# Patient Record
Sex: Male | Born: 1970 | Race: White | Hispanic: No | Smoking: Current some day smoker
Health system: Southern US, Community
[De-identification: ages and names within clinical notes are randomized; demographics above are authoritative.]

## PROBLEM LIST (undated history)

## (undated) DIAGNOSIS — F319 Bipolar disorder, unspecified: Secondary | ICD-10-CM

---

## 1999-03-26 ENCOUNTER — Encounter: Payer: Self-pay | Admitting: Emergency Medicine

## 1999-03-26 ENCOUNTER — Emergency Department (HOSPITAL_COMMUNITY): Admission: EM | Admit: 1999-03-26 | Discharge: 1999-03-26 | Payer: Self-pay | Admitting: Emergency Medicine

## 1999-04-02 ENCOUNTER — Emergency Department (HOSPITAL_COMMUNITY): Admission: EM | Admit: 1999-04-02 | Discharge: 1999-04-02 | Payer: Self-pay | Admitting: Emergency Medicine

## 2009-02-03 ENCOUNTER — Emergency Department (HOSPITAL_COMMUNITY): Admission: EM | Admit: 2009-02-03 | Discharge: 2009-02-04 | Payer: Self-pay | Admitting: Emergency Medicine

## 2009-02-03 ENCOUNTER — Ambulatory Visit: Payer: Self-pay | Admitting: Psychiatry

## 2009-02-04 ENCOUNTER — Inpatient Hospital Stay (HOSPITAL_COMMUNITY): Admission: EM | Admit: 2009-02-04 | Discharge: 2009-02-09 | Payer: Self-pay | Admitting: Psychiatry

## 2010-09-10 LAB — COMPREHENSIVE METABOLIC PANEL
ALT: 40 U/L (ref 0–53)
AST: 34 U/L (ref 0–37)
Albumin: 3.8 g/dL (ref 3.5–5.2)
Alkaline Phosphatase: 63 U/L (ref 39–117)
BUN: 12 mg/dL (ref 6–23)
CO2: 29 mEq/L (ref 19–32)
Calcium: 9.6 mg/dL (ref 8.4–10.5)
Chloride: 103 mEq/L (ref 96–112)
Creatinine, Ser: 0.95 mg/dL (ref 0.4–1.5)
GFR calc Af Amer: >60 mL/min (ref >60)
GFR calc non Af Amer: >60 mL/min (ref >60)
Glucose, Bld: 98 mg/dL (ref 70–99)
Potassium: 4.4 mEq/L (ref 3.5–5.1)
Sodium: 139 mEq/L (ref 135–145)
Total Bilirubin: 0.6 mg/dL (ref 0.3–1.2)
Total Protein: 7.8 g/dL (ref 6.0–8.3)

## 2010-09-10 LAB — RPR: RPR Ser Ql: NONREACTIVE

## 2010-09-10 LAB — TSH: TSH: 2.096 u[IU]/mL (ref 0.350–4.500)

## 2010-09-11 LAB — BASIC METABOLIC PANEL
BUN: 11 mg/dL (ref 6–23)
Calcium: 9.3 mg/dL (ref 8.4–10.5)
Creatinine, Ser: 1.05 mg/dL (ref 0.4–1.5)
GFR calc non Af Amer: >60 mL/min (ref >60)

## 2010-09-11 LAB — DIFFERENTIAL
Basophils Absolute: 0 10*3/uL (ref 0.0–0.1)
Eosinophils Absolute: 0 10*3/uL (ref 0.0–0.7)
Lymphocytes Relative: 11 % — ABNORMAL LOW (ref 12–46)
Lymphs Abs: 1.3 10*3/uL (ref 0.7–4.0)
Neutrophils Relative %: 80 % — ABNORMAL HIGH (ref 43–77)

## 2010-09-11 LAB — URINALYSIS, ROUTINE W REFLEX MICROSCOPIC
Glucose, UA: NEGATIVE mg/dL
Leukocytes, UA: NEGATIVE
Nitrite: NEGATIVE
Protein, ur: 30 mg/dL — AB
pH: 5.5 (ref 5.0–8.0)

## 2010-09-11 LAB — URINE CULTURE: Culture: NO GROWTH

## 2010-09-11 LAB — CBC
Platelets: 249 10*3/uL (ref 150–400)
WBC: 11.5 10*3/uL — ABNORMAL HIGH (ref 4.0–10.5)

## 2010-09-11 LAB — ETHANOL: Alcohol, Ethyl (B): <5 mg/dL (ref 0–10)

## 2010-09-11 LAB — URINE MICROSCOPIC-ADD ON

## 2010-09-11 LAB — RAPID URINE DRUG SCREEN, HOSP PERFORMED
Amphetamines: NOT DETECTED
Cocaine: NOT DETECTED
Tetrahydrocannabinol: NOT DETECTED
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
## 2014-03-16 ENCOUNTER — Observation Stay (HOSPITAL_COMMUNITY)
Admission: EM | Admit: 2014-03-16 | Discharge: 2014-03-18 | Disposition: A | Discharge status: Home / Self Care | Payer: BC Managed Care – PPO | Attending: General Surgery | Admitting: General Surgery

## 2014-03-16 ENCOUNTER — Emergency Department (HOSPITAL_COMMUNITY): Payer: BC Managed Care – PPO

## 2014-03-16 ENCOUNTER — Encounter (HOSPITAL_COMMUNITY): Payer: Self-pay | Admitting: Emergency Medicine

## 2014-03-16 DIAGNOSIS — S2232XA Fracture of one rib, left side, initial encounter for closed fracture: Secondary | ICD-10-CM

## 2014-03-16 DIAGNOSIS — Y92830 Public park as the place of occurrence of the external cause: Secondary | ICD-10-CM | POA: Diagnosis not present

## 2014-03-16 DIAGNOSIS — S2239XA Fracture of one rib, unspecified side, initial encounter for closed fracture: Secondary | ICD-10-CM | POA: Diagnosis present

## 2014-03-16 DIAGNOSIS — F17210 Nicotine dependence, cigarettes, uncomplicated: Secondary | ICD-10-CM | POA: Diagnosis not present

## 2014-03-16 DIAGNOSIS — S2249XA Multiple fractures of ribs, unspecified side, initial encounter for closed fracture: Secondary | ICD-10-CM | POA: Diagnosis not present

## 2014-03-16 DIAGNOSIS — S61412A Laceration without foreign body of left hand, initial encounter: Secondary | ICD-10-CM | POA: Diagnosis not present

## 2014-03-16 LAB — COMPREHENSIVE METABOLIC PANEL
ALBUMIN: 3.5 g/dL (ref 3.5–5.2)
ALT: 72 U/L — ABNORMAL HIGH (ref 0–53)
AST: 55 U/L — AB (ref 0–37)
Alkaline Phosphatase: 65 U/L (ref 39–117)
Anion gap: 13 (ref 5–15)
BUN: 14 mg/dL (ref 6–23)
CALCIUM: 9.1 mg/dL (ref 8.4–10.5)
CO2: 24 mEq/L (ref 19–32)
CREATININE: 1.01 mg/dL (ref 0.50–1.35)
Chloride: 102 mEq/L (ref 96–112)
GFR calc Af Amer: >90 mL/min (ref >90)
GFR calc non Af Amer: 89 mL/min — ABNORMAL LOW (ref >90)
Glucose, Bld: 131 mg/dL — ABNORMAL HIGH (ref 70–99)
Potassium: 3.6 mEq/L — ABNORMAL LOW (ref 3.7–5.3)
Sodium: 139 mEq/L (ref 137–147)
Total Bilirubin: 0.3 mg/dL (ref 0.3–1.2)
Total Protein: 7.4 g/dL (ref 6.0–8.3)

## 2014-03-16 LAB — CBC
HEMATOCRIT: 44.4 % (ref 39.0–52.0)
HEMOGLOBIN: 15.7 g/dL (ref 13.0–17.0)
MCH: 32.1 pg (ref 26.0–34.0)
MCHC: 35.4 g/dL (ref 30.0–36.0)
MCV: 90.8 fL (ref 78.0–100.0)
Platelets: 258 10*3/uL (ref 150–400)
RBC: 4.89 MIL/uL (ref 4.22–5.81)
RDW: 12 % (ref 11.5–15.5)
WBC: 12.1 10*3/uL — AB (ref 4.0–10.5)

## 2014-03-16 LAB — I-STAT CHEM 8, ED
BUN: 14 mg/dL (ref 6–23)
CALCIUM ION: 1.09 mmol/L — AB (ref 1.12–1.23)
CHLORIDE: 105 meq/L (ref 96–112)
Creatinine, Ser: 0.8 mg/dL (ref 0.50–1.35)
GLUCOSE: 126 mg/dL — AB (ref 70–99)
HCT: 49 % (ref 39.0–52.0)
Hemoglobin: 16.7 g/dL (ref 13.0–17.0)
Potassium: 4.2 mEq/L (ref 3.7–5.3)
Sodium: 142 mEq/L (ref 137–147)
TCO2: 24 mmol/L (ref 0–100)

## 2014-03-16 LAB — CDS SEROLOGY

## 2014-03-16 LAB — SAMPLE TO BLOOD BANK

## 2014-03-16 LAB — PROTIME-INR
INR: 1.01 (ref 0.00–1.49)
Prothrombin Time: 13.3 seconds (ref 11.6–15.2)

## 2014-03-16 LAB — ETHANOL: Alcohol, Ethyl (B): <11 mg/dL (ref 0–11)

## 2014-03-16 MED ORDER — BUPIVACAINE HCL 0.25 % IJ SOLN
10.0000 mL | Freq: Once | INTRAMUSCULAR | Status: AC
  Administered 2014-03-16: 10 mL
  Filled 2014-03-16: qty 10

## 2014-03-16 MED ORDER — TETANUS-DIPHTH-ACELL PERTUSSIS 5-2.5-18.5 LF-MCG/0.5 IM SUSP
0.5000 mL | Freq: Once | INTRAMUSCULAR | Status: AC
  Administered 2014-03-16: 0.5 mL via INTRAMUSCULAR
  Filled 2014-03-16: qty 0.5

## 2014-03-16 MED ORDER — ONDANSETRON HCL 4 MG/2ML IJ SOLN: 4.0000 mg | Freq: Four times a day (QID) | INTRAMUSCULAR | Status: DC | PRN

## 2014-03-16 MED ORDER — SODIUM CHLORIDE 0.9 % IV SOLN
INTRAVENOUS | Status: DC
  Administered 2014-03-16: via INTRAVENOUS

## 2014-03-16 MED ORDER — ENOXAPARIN SODIUM 40 MG/0.4ML ~~LOC~~ SOLN
40.0000 mg | SUBCUTANEOUS | Status: DC
  Filled 2014-03-16: qty 0.4

## 2014-03-16 MED ORDER — PANTOPRAZOLE SODIUM 40 MG IV SOLR
40.0000 mg | Freq: Every day | INTRAVENOUS | Status: DC
  Filled 2014-03-16: qty 40

## 2014-03-16 MED ORDER — PANTOPRAZOLE SODIUM 40 MG PO TBEC: 40.0000 mg | DELAYED_RELEASE_TABLET | Freq: Every day | ORAL | Status: DC

## 2014-03-16 MED ORDER — HYDROMORPHONE HCL 1 MG/ML IJ SOLN
1.0000 mg | Freq: Once | INTRAMUSCULAR | Status: AC
  Administered 2014-03-16: 1 mg via INTRAVENOUS
  Filled 2014-03-16: qty 1

## 2014-03-16 MED ORDER — SODIUM CHLORIDE 0.9 % IV BOLUS (SEPSIS)
1000.0000 mL | Freq: Once | INTRAVENOUS | Status: AC
  Administered 2014-03-16: 1000 mL via INTRAVENOUS

## 2014-03-16 MED ORDER — FENTANYL CITRATE 0.05 MG/ML IJ SOLN
100.0000 ug | Freq: Once | INTRAMUSCULAR | Status: AC
  Administered 2014-03-16: 100 ug via INTRAVENOUS
  Filled 2014-03-16: qty 2

## 2014-03-16 MED ORDER — CEFAZOLIN SODIUM 1-5 GM-% IV SOLN
1.0000 g | Freq: Three times a day (TID) | INTRAVENOUS | Status: DC
  Administered 2014-03-16: 1 g via INTRAVENOUS
  Filled 2014-03-16 (×2): qty 50

## 2014-03-16 MED ORDER — LIDOCAINE HCL (PF) 1 % IJ SOLN
5.0000 mL | Freq: Once | INTRAMUSCULAR | Status: AC
  Administered 2014-03-16: 5 mL via INTRADERMAL
  Filled 2014-03-16: qty 5

## 2014-03-16 MED ORDER — ONDANSETRON HCL 4 MG PO TABS: 4.0000 mg | ORAL_TABLET | Freq: Four times a day (QID) | ORAL | Status: DC | PRN

## 2014-03-16 MED ORDER — CEFAZOLIN SODIUM 1-5 GM-% IV SOLN
1.0000 g | Freq: Three times a day (TID) | INTRAVENOUS | Status: DC
Start: 2014-03-17 — End: 2014-03-17
  Administered 2014-03-17: 1 g via INTRAVENOUS
  Filled 2014-03-16 (×3): qty 50

## 2014-03-16 MED ORDER — HYDROCODONE-ACETAMINOPHEN 5-325 MG PO TABS
2.0000 | ORAL_TABLET | ORAL | Status: DC | PRN
  Administered 2014-03-17 – 2014-03-18 (×2): 2 via ORAL
  Filled 2014-03-16 (×3): qty 2

## 2014-03-16 MED ORDER — IOHEXOL 300 MG/ML  SOLN
100.0000 mL | Freq: Once | INTRAMUSCULAR | Status: AC | PRN
  Administered 2014-03-16: 100 mL via INTRAVENOUS

## 2014-03-16 MED ORDER — HYDROMORPHONE HCL 1 MG/ML IJ SOLN
1.0000 mg | INTRAMUSCULAR | Status: DC | PRN
  Administered 2014-03-17 (×4): 1 mg via INTRAVENOUS
  Filled 2014-03-16 (×5): qty 1
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
## 2014-03-16 NOTE — ED Notes (Signed)
Portable xray at bedside.
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
## 2014-03-16 NOTE — ED Notes (Signed)
Patient returned from CT

## 2014-03-16 NOTE — ED Notes (Signed)
The pt returned from xray c/o pain   bp still low.  Iv nss wo.  Family at the bedside

## 2014-03-16 NOTE — ED Notes (Signed)
The pt just came back from xray .  They just took him back to c-t bp still low.  C/o pain everywhere especially in  His lt ribs anterior and posterior
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
## 2014-03-16 NOTE — ED Notes (Signed)
MD at bedside to suture pt hand

## 2014-03-16 NOTE — ED Notes (Signed)
Dr Wyatt at bedside.  

## 2014-03-17 ENCOUNTER — Observation Stay (HOSPITAL_COMMUNITY): Payer: BC Managed Care – PPO

## 2014-03-17 DIAGNOSIS — S61412A Laceration without foreign body of left hand, initial encounter: Secondary | ICD-10-CM | POA: Diagnosis present

## 2014-03-17 LAB — CBC
HEMATOCRIT: 43.2 % (ref 39.0–52.0)
Hemoglobin: 14.7 g/dL (ref 13.0–17.0)
MCH: 32.2 pg (ref 26.0–34.0)
MCHC: 34 g/dL (ref 30.0–36.0)
MCV: 94.7 fL (ref 78.0–100.0)
Platelets: 234 10*3/uL (ref 150–400)
RBC: 4.56 MIL/uL (ref 4.22–5.81)
RDW: 12.5 % (ref 11.5–15.5)
WBC: 10.3 10*3/uL (ref 4.0–10.5)

## 2014-03-17 LAB — BASIC METABOLIC PANEL
Anion gap: 12 (ref 5–15)
BUN: 11 mg/dL (ref 6–23)
CALCIUM: 8.5 mg/dL (ref 8.4–10.5)
CO2: 23 mEq/L (ref 19–32)
CREATININE: 0.84 mg/dL (ref 0.50–1.35)
Chloride: 105 mEq/L (ref 96–112)
Glucose, Bld: 112 mg/dL — ABNORMAL HIGH (ref 70–99)
Potassium: 4.1 mEq/L (ref 3.7–5.3)
Sodium: 140 mEq/L (ref 137–147)

## 2014-03-17 MED ORDER — DOCUSATE SODIUM 100 MG PO CAPS
100.0000 mg | ORAL_CAPSULE | Freq: Two times a day (BID) | ORAL | Status: DC
  Administered 2014-03-17 (×2): 100 mg via ORAL
  Filled 2014-03-17 (×4): qty 1

## 2014-03-17 MED ORDER — PNEUMOCOCCAL VAC POLYVALENT 25 MCG/0.5ML IJ INJ
0.5000 mL | INJECTION | INTRAMUSCULAR | Status: DC
  Filled 2014-03-17: qty 0.5

## 2014-03-17 MED ORDER — OXYCODONE HCL 5 MG PO TABS
5.0000 mg | ORAL_TABLET | ORAL | Status: DC | PRN
  Administered 2014-03-17 (×2): 10 mg via ORAL
  Administered 2014-03-18 (×2): 15 mg via ORAL
  Filled 2014-03-17 (×2): qty 2
  Filled 2014-03-17: qty 3
  Filled 2014-03-17: qty 1
  Filled 2014-03-17: qty 2

## 2014-03-17 MED ORDER — ENOXAPARIN SODIUM 40 MG/0.4ML ~~LOC~~ SOLN
40.0000 mg | Freq: Two times a day (BID) | SUBCUTANEOUS | Status: DC
Start: 2014-03-17 — End: 2014-03-18
  Administered 2014-03-17 (×2): 40 mg via SUBCUTANEOUS
  Filled 2014-03-17 (×5): qty 0.4

## 2014-03-17 MED ORDER — POLYETHYLENE GLYCOL 3350 17 G PO PACK
17.0000 g | PACK | Freq: Every day | ORAL | Status: DC
  Administered 2014-03-17: 17 g via ORAL
  Filled 2014-03-17 (×3): qty 1

## 2014-03-17 MED ORDER — HYDROMORPHONE HCL 1 MG/ML IJ SOLN
0.5000 mg | INTRAMUSCULAR | Status: DC | PRN
  Filled 2014-03-17: qty 1

## 2014-03-17 NOTE — Progress Notes (Signed)
Patient ID: Jared CrossLonnie Costa, male   DOB: 01/24/1971, 43 y.o.   MRN: [id number]   LOS: 1 day   Subjective: No unexpected c/o.   Objective: Vital signs in last 24 hours: Temp:  [97.9 F (36.6 C)-99.2 F (37.3 C)] 99.2 F (37.3 C) (10/12 0731) Pulse Rate:  [70-107] 96 (10/12 0731) Resp:  [10-20] 18 (10/12 0731) BP: (95-126)/(57-96) 106/72 mmHg (10/12 0731) SpO2:  [93 %-100 %] 97 % (10/12 0731) Weight:  [280 lb 13.9 oz (127.4 kg)] 280 lb 13.9 oz (127.4 kg) (10/12 0134)    IS: 1250ml   Laboratory  CBC  Recent Labs  03/16/14 1741 03/16/14 2153 03/17/14 0631  WBC 12.1*  --  10.3  HGB 15.7 16.7 14.7  HCT 44.4 49.0 43.2  PLT 258  --  234   BMET  Recent Labs  03/16/14 1741 03/16/14 2153 03/17/14 0631  NA 139 142 140  K 3.6* 4.2 4.1  CL 102 105 105  CO2 24  --  23  GLUCOSE 131* 126* 112*  BUN 14 14 11   CREATININE 1.01 0.80 0.84  CALCIUM 9.1  --  8.5    Radiology Results CHEST 2 VIEW  COMPARISON: Chest radiograph and chest CT March 16, 2014  FINDINGS:  There are displaced fractures of the posterior left third and  posterolateral left fourth ribs. No pneumothorax or effusion. Lungs  are clear. Heart size and pulmonary vascularity are normal.  IMPRESSION:  Rib fractures on the left. No pneumothorax. Lungs clear.  Electronically Signed  By: Bretta BangWilliam Woodruff M.D.  On: 03/17/2014 08:03   Physical Exam General appearance: alert and no distress Resp: clear to auscultation bilaterally Cardio: regular rate and rhythm GI: normal findings: bowel sounds normal and soft, non-tender   Assessment/Plan: [facility] Multiple left rib fxs -- Pulmonary toilet Left hand laceration s/p repair -- Dr. Ophelia CharterYates to see this am FEN -- Orals for pain, SL IV, bowel regimen, advance diet VTE -- SCD's, Lovenox (increase for weight) Dispo -- Pain control, PT/OT    Jared CaldronMichael J. Crosby Bevan, PA-C Pager: [phone] General Trauma PA Pager: [phone]  03/17/2014

## 2014-03-17 NOTE — ED Notes (Signed)
Transporting patient to new room assignment.,

## 2014-03-17 NOTE — Progress Notes (Signed)
I spoke with his family also. Patient examined and I agree with the assessment and plan  Violeta GelinasBurke Keaden Gunnoe, MD, MPH, FACS Trauma: [phone] General Surgery: [phone]  03/17/2014 12:29 PM
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
## 2014-03-17 NOTE — Progress Notes (Signed)
UR completed 

## 2014-03-18 MED ORDER — NAPROXEN 500 MG PO TABS: 500.0000 mg | ORAL_TABLET | Freq: Two times a day (BID) | ORAL | Status: DC

## 2014-03-18 MED ORDER — OXYCODONE-ACETAMINOPHEN 7.5-325 MG PO TABS: 1.0000 | ORAL_TABLET | ORAL | Status: DC | PRN
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
## 2014-03-18 NOTE — Discharge Instructions (Signed)
Increase activity as pain allows.  Wash wounds daily in shower with soap and water. Do not soak. Apply antibiotic ointment (e.g. Neosporin) twice daily and as needed to keep moist. Cover with dry dressing.  No driving while taking oxycodone.
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
## 2014-03-18 NOTE — Discharge Summary (Signed)
Averyana Pillars, MD, MPH, FACS Trauma: [phone] General Surgery: [phone]  

## 2014-03-18 NOTE — Progress Notes (Signed)
Dressing removed

## 2014-03-18 NOTE — Progress Notes (Signed)
Agree Sheza Strickland, MD, MPH, FACS Trauma: [phone] General Surgery: [phone]  
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
## 2016-08-10 DIAGNOSIS — Z6835 Body mass index (BMI) 35.0-35.9, adult: Secondary | ICD-10-CM | POA: Diagnosis not present

## 2016-08-10 DIAGNOSIS — Z0000 Encounter for general adult medical examination without abnormal findings: Secondary | ICD-10-CM | POA: Diagnosis not present

## 2016-08-10 DIAGNOSIS — Z1389 Encounter for screening for other disorder: Secondary | ICD-10-CM | POA: Diagnosis not present

## 2016-08-11 DIAGNOSIS — Z6835 Body mass index (BMI) 35.0-35.9, adult: Secondary | ICD-10-CM | POA: Diagnosis not present

## 2016-08-11 DIAGNOSIS — Z024 Encounter for examination for driving license: Secondary | ICD-10-CM | POA: Diagnosis not present

## 2016-08-11 DIAGNOSIS — Z1389 Encounter for screening for other disorder: Secondary | ICD-10-CM | POA: Diagnosis not present

## 2016-08-11 DIAGNOSIS — E6609 Other obesity due to excess calories: Secondary | ICD-10-CM | POA: Diagnosis not present

## 2018-01-17 DIAGNOSIS — Z0000 Encounter for general adult medical examination without abnormal findings: Secondary | ICD-10-CM | POA: Diagnosis not present

## 2018-01-17 DIAGNOSIS — E6609 Other obesity due to excess calories: Secondary | ICD-10-CM | POA: Diagnosis not present

## 2018-01-17 DIAGNOSIS — Z6835 Body mass index (BMI) 35.0-35.9, adult: Secondary | ICD-10-CM | POA: Diagnosis not present

## 2018-01-17 DIAGNOSIS — Z719 Counseling, unspecified: Secondary | ICD-10-CM | POA: Diagnosis not present

## 2018-01-17 DIAGNOSIS — Z1389 Encounter for screening for other disorder: Secondary | ICD-10-CM | POA: Diagnosis not present

## 2018-07-02 DIAGNOSIS — Z0000 Encounter for general adult medical examination without abnormal findings: Secondary | ICD-10-CM | POA: Diagnosis not present

## 2018-07-02 DIAGNOSIS — E6609 Other obesity due to excess calories: Secondary | ICD-10-CM | POA: Diagnosis not present

## 2018-07-02 DIAGNOSIS — Z6836 Body mass index (BMI) 36.0-36.9, adult: Secondary | ICD-10-CM | POA: Diagnosis not present

## 2018-07-02 DIAGNOSIS — Z1389 Encounter for screening for other disorder: Secondary | ICD-10-CM | POA: Diagnosis not present

## 2018-09-04 DIAGNOSIS — R51 Headache: Secondary | ICD-10-CM | POA: Diagnosis not present

## 2018-09-04 DIAGNOSIS — K591 Functional diarrhea: Secondary | ICD-10-CM | POA: Diagnosis not present

## 2019-05-14 ENCOUNTER — Other Ambulatory Visit: Payer: Self-pay

## 2019-05-14 DIAGNOSIS — Z20822 Contact with and (suspected) exposure to covid-19: Secondary | ICD-10-CM

## 2019-05-16 ENCOUNTER — Telehealth: Payer: Self-pay | Admitting: *Deleted

## 2019-05-16 NOTE — Telephone Encounter (Signed)
Pt called for result of COVID test on 05/14/2019; explained turn around time for results is based on the number of results that have to be completed; pt also informed results are available first in MyChart, and he will receive a call regarding his results; pt encouraged to answer all calls; he verbalized understanding.

## 2019-05-19 LAB — NOVEL CORONAVIRUS, NAA: SARS-CoV-2, NAA: NOT DETECTED

## 2019-05-19 NOTE — Telephone Encounter (Signed)
Pt called for lab results. Result are not back. Advised pt to call back if results are not back tomorrow.

## 2019-07-09 DIAGNOSIS — Z20828 Contact with and (suspected) exposure to other viral communicable diseases: Secondary | ICD-10-CM | POA: Diagnosis not present

## 2019-07-09 DIAGNOSIS — Z03818 Encounter for observation for suspected exposure to other biological agents ruled out: Secondary | ICD-10-CM | POA: Diagnosis not present

## 2020-07-01 DIAGNOSIS — Z1331 Encounter for screening for depression: Secondary | ICD-10-CM | POA: Diagnosis not present

## 2020-07-01 DIAGNOSIS — Z1389 Encounter for screening for other disorder: Secondary | ICD-10-CM | POA: Diagnosis not present

## 2020-07-01 DIAGNOSIS — Z6835 Body mass index (BMI) 35.0-35.9, adult: Secondary | ICD-10-CM | POA: Diagnosis not present

## 2020-07-01 DIAGNOSIS — Z0000 Encounter for general adult medical examination without abnormal findings: Secondary | ICD-10-CM | POA: Diagnosis not present

## 2020-07-01 DIAGNOSIS — E6609 Other obesity due to excess calories: Secondary | ICD-10-CM | POA: Diagnosis not present

## 2021-01-01 DIAGNOSIS — Z72820 Sleep deprivation: Secondary | ICD-10-CM | POA: Diagnosis not present

## 2021-01-01 DIAGNOSIS — R4589 Other symptoms and signs involving emotional state: Secondary | ICD-10-CM | POA: Diagnosis not present

## 2021-01-01 DIAGNOSIS — G4700 Insomnia, unspecified: Secondary | ICD-10-CM | POA: Diagnosis not present

## 2021-01-01 DIAGNOSIS — R5383 Other fatigue: Secondary | ICD-10-CM | POA: Diagnosis not present

## 2021-01-01 DIAGNOSIS — Z6833 Body mass index (BMI) 33.0-33.9, adult: Secondary | ICD-10-CM | POA: Diagnosis not present

## 2021-01-01 DIAGNOSIS — F3189 Other bipolar disorder: Secondary | ICD-10-CM | POA: Diagnosis not present

## 2021-01-01 DIAGNOSIS — E6609 Other obesity due to excess calories: Secondary | ICD-10-CM | POA: Diagnosis not present

## 2021-03-15 DIAGNOSIS — Z5900 Homelessness unspecified: Secondary | ICD-10-CM | POA: Diagnosis not present

## 2021-03-16 DIAGNOSIS — R45850 Homicidal ideations: Secondary | ICD-10-CM | POA: Diagnosis not present

## 2021-03-16 DIAGNOSIS — Z20822 Contact with and (suspected) exposure to covid-19: Secondary | ICD-10-CM | POA: Diagnosis not present

## 2021-03-16 DIAGNOSIS — Z5900 Homelessness unspecified: Secondary | ICD-10-CM | POA: Diagnosis not present

## 2021-03-16 DIAGNOSIS — F319 Bipolar disorder, unspecified: Secondary | ICD-10-CM | POA: Diagnosis not present

## 2021-03-16 DIAGNOSIS — F4325 Adjustment disorder with mixed disturbance of emotions and conduct: Secondary | ICD-10-CM | POA: Diagnosis not present

## 2021-03-16 DIAGNOSIS — M79669 Pain in unspecified lower leg: Secondary | ICD-10-CM | POA: Diagnosis not present

## 2021-03-16 DIAGNOSIS — F3112 Bipolar disorder, current episode manic without psychotic features, moderate: Secondary | ICD-10-CM | POA: Diagnosis not present

## 2021-03-16 DIAGNOSIS — F441 Dissociative fugue: Secondary | ICD-10-CM | POA: Diagnosis not present

## 2021-03-16 DIAGNOSIS — F17210 Nicotine dependence, cigarettes, uncomplicated: Secondary | ICD-10-CM | POA: Diagnosis not present

## 2021-03-31 DIAGNOSIS — F333 Major depressive disorder, recurrent, severe with psychotic symptoms: Secondary | ICD-10-CM | POA: Diagnosis not present

## 2021-03-31 DIAGNOSIS — Z6834 Body mass index (BMI) 34.0-34.9, adult: Secondary | ICD-10-CM | POA: Diagnosis not present

## 2023-03-19 ENCOUNTER — Encounter (HOSPITAL_COMMUNITY): Payer: Self-pay

## 2023-03-19 ENCOUNTER — Emergency Department (HOSPITAL_COMMUNITY)
Admission: EM | Admit: 2023-03-19 | Discharge: 2023-03-21 | Disposition: A | Discharge status: Home / Self Care | Payer: Commercial Managed Care - PPO | Attending: Emergency Medicine | Admitting: Emergency Medicine

## 2023-03-19 ENCOUNTER — Other Ambulatory Visit: Payer: Self-pay

## 2023-03-19 DIAGNOSIS — F312 Bipolar disorder, current episode manic severe with psychotic features: Secondary | ICD-10-CM | POA: Insufficient documentation

## 2023-03-19 DIAGNOSIS — F17200 Nicotine dependence, unspecified, uncomplicated: Secondary | ICD-10-CM | POA: Insufficient documentation

## 2023-03-19 DIAGNOSIS — F3110 Bipolar disorder, current episode manic without psychotic features, unspecified: Secondary | ICD-10-CM | POA: Insufficient documentation

## 2023-03-19 DIAGNOSIS — Z8659 Personal history of other mental and behavioral disorders: Secondary | ICD-10-CM

## 2023-03-19 DIAGNOSIS — Z7982 Long term (current) use of aspirin: Secondary | ICD-10-CM | POA: Insufficient documentation

## 2023-03-19 DIAGNOSIS — R4689 Other symptoms and signs involving appearance and behavior: Secondary | ICD-10-CM | POA: Diagnosis present

## 2023-03-19 DIAGNOSIS — F29 Unspecified psychosis not due to a substance or known physiological condition: Secondary | ICD-10-CM | POA: Insufficient documentation

## 2023-03-19 HISTORY — DX: Bipolar disorder, unspecified: F31.9

## 2023-03-19 LAB — URINALYSIS, W/ REFLEX TO CULTURE (INFECTION SUSPECTED)
Bacteria, UA: NONE SEEN
Bilirubin Urine: NEGATIVE
Glucose, UA: NEGATIVE mg/dL
Hgb urine dipstick: NEGATIVE
Ketones, ur: 5 mg/dL — AB
Leukocytes,Ua: NEGATIVE
Nitrite: NEGATIVE
Protein, ur: 30 mg/dL — AB
Specific Gravity, Urine: 1.02 (ref 1.005–1.030)
pH: 7 (ref 5.0–8.0)

## 2023-03-19 LAB — CBC WITH DIFFERENTIAL/PLATELET
Abs Immature Granulocytes: 0.03 10*3/uL (ref 0.00–0.07)
Basophils Absolute: 0.1 10*3/uL (ref 0.0–0.1)
Basophils Relative: 1 %
Eosinophils Absolute: 0.1 10*3/uL (ref 0.0–0.5)
Eosinophils Relative: 2 %
HCT: 46.3 % (ref 39.0–52.0)
Hemoglobin: 15.4 g/dL (ref 13.0–17.0)
Immature Granulocytes: 0 %
Lymphocytes Relative: 20 %
Lymphs Abs: 1.5 10*3/uL (ref 0.7–4.0)
MCH: 32.1 pg (ref 26.0–34.0)
MCHC: 33.3 g/dL (ref 30.0–36.0)
MCV: 96.5 fL (ref 80.0–100.0)
Monocytes Absolute: 0.7 10*3/uL (ref 0.1–1.0)
Monocytes Relative: 10 %
Neutro Abs: 4.9 10*3/uL (ref 1.7–7.7)
Neutrophils Relative %: 67 %
Platelets: 244 10*3/uL (ref 150–400)
RBC: 4.8 MIL/uL (ref 4.22–5.81)
RDW: 12.1 % (ref 11.5–15.5)
WBC: 7.3 10*3/uL (ref 4.0–10.5)
nRBC: 0 % (ref 0.0–0.2)

## 2023-03-19 LAB — COMPREHENSIVE METABOLIC PANEL
ALT: 54 U/L — ABNORMAL HIGH (ref 0–44)
AST: 44 U/L — ABNORMAL HIGH (ref 15–41)
Albumin: 3.7 g/dL (ref 3.5–5.0)
Alkaline Phosphatase: 57 U/L (ref 38–126)
Anion gap: 8 (ref 5–15)
BUN: 20 mg/dL (ref 6–20)
CO2: 24 mmol/L (ref 22–32)
Calcium: 9 mg/dL (ref 8.9–10.3)
Chloride: 105 mmol/L (ref 98–111)
Creatinine, Ser: 1.08 mg/dL (ref 0.61–1.24)
GFR, Estimated: >60 mL/min (ref >60)
Glucose, Bld: 118 mg/dL — ABNORMAL HIGH (ref 70–99)
Potassium: 3.6 mmol/L (ref 3.5–5.1)
Sodium: 137 mmol/L (ref 135–145)
Total Bilirubin: 0.5 mg/dL (ref 0.3–1.2)
Total Protein: 6.8 g/dL (ref 6.5–8.1)

## 2023-03-19 LAB — RAPID URINE DRUG SCREEN, HOSP PERFORMED
Amphetamines: NOT DETECTED
Barbiturates: NOT DETECTED
Benzodiazepines: NOT DETECTED
Cocaine: NOT DETECTED
Opiates: NOT DETECTED
Tetrahydrocannabinol: NOT DETECTED

## 2023-03-19 LAB — LITHIUM LEVEL: Lithium Lvl: <0.06 mmol/L — ABNORMAL LOW (ref 0.60–1.20)

## 2023-03-19 LAB — ETHANOL: Alcohol, Ethyl (B): <10 mg/dL (ref <10)

## 2023-03-19 MED ORDER — ZIPRASIDONE MESYLATE 20 MG IM SOLR: 20.0000 mg | Freq: Four times a day (QID) | INTRAMUSCULAR | Status: DC | PRN

## 2023-03-19 MED ORDER — LORAZEPAM 1 MG PO TABS
1.0000 mg | ORAL_TABLET | Freq: Once | ORAL | Status: AC
  Administered 2023-03-19: 1 mg via ORAL
  Filled 2023-03-19: qty 1

## 2023-03-19 MED ORDER — LORAZEPAM 1 MG PO TABS
1.0000 mg | ORAL_TABLET | Freq: Four times a day (QID) | ORAL | Status: DC | PRN
  Administered 2023-03-19 – 2023-03-20 (×2): 1 mg via ORAL
  Filled 2023-03-19 (×2): qty 1

## 2023-03-19 MED ORDER — STERILE WATER FOR INJECTION IJ SOLN: 1.2000 mL | Freq: Once | INTRAMUSCULAR | Status: DC

## 2023-03-19 NOTE — ED Notes (Signed)
Pt is awake. Pt seems confused. Pt is being escorted to restroom accompanied by sitter. Urine sample requested.

## 2023-03-19 NOTE — ED Notes (Signed)
Pt standing in door frame talking with sitter

## 2023-03-19 NOTE — ED Notes (Signed)
Pt resting in bed with eyes closed; sitter at bedside. Without incident this far in shift.
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
## 2023-03-19 NOTE — ED Notes (Signed)
Pt in room talking to himself. No one else in room

## 2023-03-19 NOTE — Progress Notes (Signed)
LCSW Progress Note  [id number]   Jared Costa  03/19/2023  7:00 PM    Inpatient Behavioral Health Placement  Pt meets inpatient criteria per Vernard Gambles, NP. There are no available beds within [facility] system per [facility] [employer]. Referral was sent to the following facilities;  Destination  Service Provider Address Phone [facility]  [address] [place], [place] [address] [phone]  [facility]  [address] [place] [address] [phone]  [facility]  [address] [place] [address] [phone]  [facility] [address]  [place] [address] [phone]  [facility]  [address] [place], [place] [address] [phone]  [facility]  [address] [place] [address] [phone]  [facility]  [address] [place] [address] [phone]  [facility]  [address] [place], [place] [address] [phone]  [facility]  [address] [place] [address] [phone]  [facility]  [address] [place] [address] [phone]  [facility]  [address], [place] [address] [phone]  [facility]  [address] [place] [address] [phone]  [facility]  [address], [place] [address] [phone]  [facility]  [address] [place] [address] [phone]  [facility]  [address] [place], [place] [phone]  [facility]  [address], [place] [address] [phone]  [facility]  [address], [place] [address] [phone]  [facility]  [address] [place] [address] [phone]  [facility]  [address], [place] [address] [phone]  [facility]  [address], [place] [address] [phone]  [facility]  [place] [phone]  [facility]  [address], [place] [address] [phone]  [facility]  [address] [place] [address] [phone]  [facility]  [address], [place] [address] [phone]  [facility] Patient Placement  [facility], [place] [phone]  [facility]  [address] [place] [address] [phone]     Situation ongoing,  CSW will follow up.    Maryjean Ka, MSW, LCSWA 03/19/2023 7:00 PM

## 2023-03-19 NOTE — BHH Counselor (Signed)
Telephone contact 5:45pm  Collateral:  Parent's reported patient has been diagnosed with Bipolar and Depression. Report he cannot take Lithium due to affects his performance at work, patient works outside. Report he will not take Lithium because the patient reports it makes him sick. 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 Report he has a hard time taking pills. Patient's parent report the patient stated he took 3 pills yesterday but they do not know how true that is. Report he laid out of work last week three days Wednesday, Thursday, and Friday. Report the patient does need help. Report the day before yesterday the parent road his motorcycle up the hill wearing nothing on but his G-string. Parents report patient stays up all night with no sleep.   Collateral: Patient's son, Ndrew Creason, They report patient needs to get help before being discharged. Report he needs medication for Bipolar where he can take the medication, work outside and not have any side effects.  The family wants the patient stabilized but does not want him on Lithium due to the patient will not take the medication because it will make him sick. The patient needs to be on a medication with no side effects related to the sun or working outside.    Disposition: Eber Jones, NP, has recommended inpatient treatment.

## 2023-03-19 NOTE — ED Triage Notes (Signed)
Per [facility], a [facility] Resident called 911 reporting the patient was found at 3am standing above their 44yr granddaughter without pants on, and then they called 911. The Pt then left the residence. When [facility] responded to the call, the Pt was already in his vehicle driving away from the residence, but turned around and followed the Deputy back to the residence. Deputy found Patient exiting personal vehicle wearing a G-String, without pants. Patient reports he was asked to the residence to film a 'Zombie movie'. Per grandmother of the residence, the Deputy was told she entered the child's room and found the patient standing over the little girl, when the Pt turned to her and claimed he was their ' guardian angel'.  Per [facility] Deputy, the Pt was brought to [facility] for medical evaluation, and/or emergency commitment.   Pt provided [facility] Scrubs at this time. Pt remains calm and cooperative with ED Staff at this time.

## 2023-03-19 NOTE — ED Notes (Signed)
Lab notified RN that Lithium test has been delayed. Lab stated they will be getting the results shortly.

## 2023-03-19 NOTE — ED Notes (Addendum)
Pt clothing was removed and scrubs were provided. Wanded by security and items placed in hospital bag in locker #12  Pt lying in bed resting. Calm and cooperative. Pt is unaware of why he is here, stated "we're going to get to the bottom of this."  Pt has moments of disorganized speech when communicating. When asked why he was here he responded with "my hamster I went to see."

## 2023-03-20 DIAGNOSIS — F3110 Bipolar disorder, current episode manic without psychotic features, unspecified: Secondary | ICD-10-CM

## 2023-03-20 DIAGNOSIS — Z8659 Personal history of other mental and behavioral disorders: Secondary | ICD-10-CM

## 2023-03-20 MED ORDER — ARIPIPRAZOLE 5 MG PO TABS
5.0000 mg | ORAL_TABLET | Freq: Every day | ORAL | Status: DC
  Administered 2023-03-20: 5 mg via ORAL
  Filled 2023-03-20: qty 1

## 2023-03-20 MED ORDER — TRAZODONE HCL 50 MG PO TABS
50.0000 mg | ORAL_TABLET | Freq: Every day | ORAL | Status: DC
  Administered 2023-03-20: 50 mg via ORAL
  Filled 2023-03-20: qty 1

## 2023-03-20 MED ORDER — ACETAMINOPHEN 325 MG PO TABS
650.0000 mg | ORAL_TABLET | Freq: Four times a day (QID) | ORAL | Status: DC | PRN
  Administered 2023-03-21: 650 mg via ORAL
  Filled 2023-03-20: qty 2

## 2023-03-20 NOTE — ED Notes (Signed)
Pt has been accepted to [facility] 03/21/2023. Bed assignment: Main campus  Pt meets inpatient criteria per Ophelia Shoulder, NP  Attending Physician will be Loni Beckwith, MD  Report can be called to: [phone] (this is a pager, please leave call-back number when giving report)  Pt can arrive after 8 AM

## 2023-03-20 NOTE — ED Notes (Signed)
Pt mother, Mora Bellman, given update at this time.

## 2023-03-20 NOTE — Consult Note (Signed)
Telepsych Consultation   Reason for Consult:    "Psych Consult" Referring Physician:   Dr. Jaci Carrel Location of Patient:    [facility] Location of Provider: Other: virtual home office  Patient Identification: Jared Costa MRN:  [id number] Principal Diagnosis: Bipolar I disorder with mania (HCC) Diagnosis:  Principal Problem:   Bipolar I disorder with mania (HCC) Active Problems:   Hx of bipolar disorder   Total Time spent with patient: 1 hour  Subjective:   Jared Costa is a 52 y.o. male patient admitted with per RN Triage Note dated 03/19/2023@0342 : "Brought in by [facility]. Family called. Meds aren't working.  Hallucinating and was found inside someone's house Came in without pants.  No IVC papers."   Takes Lithium but unsure of dose.  HPI:   Jared Costa, 52 y.o., male patient   Patient seen via telepsych by this provider; chart reviewed and consulted with Dr. Viviano Simas on 03/20/23.  On evaluation Jared Costa is observed sitting in exam room, dangled at the bedside, with his parents sitting close by in adjacent chairs.  Pt greeted by this Clinical research associate and given anticipatory guidance.  He elects to have his parents remain at the bedside during the assessment.   Patient is fairly groomed, dresses in hospital scrubs.  He is alert and oriented to person and place but unsure of time.  His speech is pressured and this writer is unable to interrupt him to gain clarification.  He is quite delusional and states the house he broke into prior to admission belonged to one of the members of the rock band [place].  Per patient, I shouldn't say it but I think I need to.  This microchip shit that they have planted in my head is making me do things.  I thought I was talking to slash from [employer], they told me to come to the front door and I went to the back door."  Patients states his brain told him to go inside the home but once inside he did not see slash.  States then everyone  woke up and demanded he leave their home.  He does admit to seeing a young child there, but states he did not take his pants off and appears to have no recollection of events that led to current hospitalization.  Patient is very disorganized, is speech is pressured and his story is hard to follow. He reports a hx of manic depression but states he does not take medications because it did not feel it helped him. He denies hx of substance abuse; and denies hx of prior suicide attempts.  He reports he has a court date on 10/23 for "my second amendment rights, they wont give me my bullets."  Patient then abruptly gets up and leaves the room.  Per collateral received from his mother and father: States the only thing he won't take is lithium d/t intolerability and he likes to be in the sun.  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 Patient's father states last month, patient rode his motorcycle to [place] and got lost.  He did not have his cell phone; and was picked up by the local  sheriff's office after found on the road in the dark and cold. He reports patient spent several days at [facility] in [place] but he did not think it helped him.  He also states, last year the sheriff's office went into  his home and took his ammunition after his ex girlfriend called and took a 50b out on him.  His father states he has a court date on 10/23 to see if he can get his ammunition back.  He states patient does not have access to his firearms, he and pt's mother locked them up away from patient.   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   Labs: UDS is negative EKG 350/435 Normal sinus rhythm CMP is within normal limits His AST and ALT are marginally elevated  but within normal limits to restart meds. Pt was medically cleared before psychiatric assessment.   During evaluation Jared Costa is pt observed dangled at the bedside, (position); He is alert/oriented x to person and place only; his mood is anxious, euphoric inappropriate given circumstances; he tries to be cooperative but is limited d/t his mental decline;  and mood congruent with affect.  Patient is speaking in a clear tone, his speech is loud and pressured and this writer is unable to interrupt him to gain collateral.  His thought process is coherent and relevant; There is no indication that he is currently responding to internal/external stimuli. He is quite delusional and thinks prior to admission he entered was a member of [employer] rock band.  Patient denies suicidal/self-harm/homicidal ideation. Patient has remained cooperative throughout assessment and has answered questions appropriately.    Per ED Provider Admission Assessment 03/19/2023@0407 : History      Chief Complaint  Patient presents with   Psychiatric Evaluation      Jared Costa is a 52 y.o. male.   Presents to the emergency department by [facility].  Patient was found naked from the waist down in someone's house.  Patient reports a history of bipolar, on lithium but thinks his medications are not working.  He endorses auditory hallucinations.   Past Psychiatric History: as outlined below  Risk to Self:  yes Risk to Others:  yes [facility] Prior Outpatient Therapy:  denies  Past Medical History:  Past Medical History:  Diagnosis Date   Bipolar affective (HCC)    History reviewed. No pertinent surgical history. Family History: History reviewed. No pertinent family history. Family Psychiatric  History: denies Social History:  Social History   Substance and Sexual Activity  Alcohol Use No     Social History   Substance and  Sexual Activity  Drug Use No    Social History   Socioeconomic History   Marital status: Divorced    Spouse name: Not on file   Number of children: Not on file   Years of education: Not on file   Highest education level: Not on file  Occupational History   Not on file  Tobacco Use   Smoking status: Some Days   Smokeless tobacco: Not on file  Substance and Sexual Activity   Alcohol use: No   Drug use: No   Sexual activity: Never  Other Topics Concern   Not on file  Social History Narrative   Not on file   Social Determinants of Health   Financial Resource Strain: Not on file  Food Insecurity: Not on file  Transportation Needs: Not on file  Physical Activity: Not on file  Stress: Not  on file  Social Connections: Not on file   Additional Social History:    Allergies:  No Known Allergies  Labs:  Results for orders placed or performed during the hospital encounter of 03/19/23 (from the past 48 hour(s))  CBC with Differential/Platelet     Status: None   Collection Time: 03/19/23  4:21 AM  Result Value Ref Range   WBC 7.3 4.0 - 10.5 K/uL   RBC 4.80 4.22 - 5.81 MIL/uL   Hemoglobin 15.4 13.0 - 17.0 g/dL   HCT 57.8 46.9 - 62.9 %   MCV 96.5 80.0 - 100.0 fL   MCH 32.1 26.0 - 34.0 pg   MCHC 33.3 30.0 - 36.0 g/dL   RDW 52.8 41.3 - 24.4 %   Platelets 244 150 - 400 K/uL   nRBC 0.0 0.0 - 0.2 %   Neutrophils Relative % 67 %   Neutro Abs 4.9 1.7 - 7.7 K/uL   Lymphocytes Relative 20 %   Lymphs Abs 1.5 0.7 - 4.0 K/uL   Monocytes Relative 10 %   Monocytes Absolute 0.7 0.1 - 1.0 K/uL   Eosinophils Relative 2 %   Eosinophils Absolute 0.1 0.0 - 0.5 K/uL   Basophils Relative 1 %   Basophils Absolute 0.1 0.0 - 0.1 K/uL   Immature Granulocytes 0 %   Abs Immature Granulocytes 0.03 0.00 - 0.07 K/uL    Comment: Performed at [facility], [address], [place], [place] [address]  Comprehensive metabolic panel     Status: Abnormal   Collection Time: 03/19/23  4:21 AM  Result  Value Ref Range   Sodium 137 135 - 145 mmol/L   Potassium 3.6 3.5 - 5.1 mmol/L   Chloride 105 98 - 111 mmol/L   CO2 24 22 - 32 mmol/L   Glucose, Bld 118 (H) 70 - 99 mg/dL    Comment: Glucose reference range applies only to samples taken after fasting for at least 8 hours.   BUN 20 6 - 20 mg/dL   Creatinine, Ser 2.53 0.61 - 1.24 mg/dL   Calcium 9.0 8.9 - 66.4 mg/dL   Total Protein 6.8 6.5 - 8.1 g/dL   Albumin 3.7 3.5 - 5.0 g/dL   AST 44 (H) 15 - 41 U/L   ALT 54 (H) 0 - 44 U/L   Alkaline Phosphatase 57 38 - 126 U/L   Total Bilirubin 0.5 0.3 - 1.2 mg/dL   GFR, Estimated >40 >34 mL/min    Comment: (NOTE) Calculated using the CKD-EPI Creatinine Equation (2021)    Anion gap 8 5 - 15    Comment: Performed at [facility], [address] [place], [place] [address]  Ethanol     Status: None   Collection Time: 03/19/23  4:21 AM  Result Value Ref Range   Alcohol, Ethyl (B) <10 <10 mg/dL    Comment: (NOTE) Lowest detectable limit for serum alcohol is 10 mg/dL.  For medical purposes only. Performed at [facility], [address] [place], [place] [address]   Lithium level     Status: Abnormal   Collection Time: 03/19/23  4:21 AM  Result Value Ref Range   Lithium Lvl <0.06 (L) 0.60 - 1.20 mmol/L    Comment: Performed at [facility], [address], [place], [place] [address]  Rapid urine drug screen (hospital performed)     Status: None   Collection Time: 03/19/23  7:35 AM  Result Value Ref Range   Opiates NONE DETECTED NONE DETECTED   Cocaine  NONE DETECTED NONE DETECTED   Benzodiazepines NONE DETECTED NONE DETECTED   Amphetamines NONE DETECTED NONE DETECTED   Tetrahydrocannabinol NONE DETECTED NONE DETECTED   Barbiturates NONE DETECTED NONE DETECTED    Comment: (NOTE) DRUG SCREEN FOR MEDICAL PURPOSES ONLY.  IF CONFIRMATION IS NEEDED FOR ANY PURPOSE, NOTIFY LAB WITHIN 5 DAYS.  LOWEST DETECTABLE LIMITS FOR URINE DRUG SCREEN Drug Class                     Cutoff  (ng/mL) Amphetamine and metabolites    1000 Barbiturate and metabolites    200 Benzodiazepine                 200 Opiates and metabolites        300 Cocaine and metabolites        300 THC                            50 Performed at [facility], [address], [place], [place] [address]   Urinalysis, w/ Reflex to Culture (Infection Suspected) -Urine, Clean Catch     Status: Abnormal   Collection Time: 03/19/23  7:35 AM  Result Value Ref Range   Specimen Source URINE, CLEAN CATCH    Color, Urine YELLOW YELLOW   APPearance CLEAR CLEAR   Specific Gravity, Urine 1.020 1.005 - 1.030   pH 7.0 5.0 - 8.0   Glucose, UA NEGATIVE NEGATIVE mg/dL   Hgb urine dipstick NEGATIVE NEGATIVE   Bilirubin Urine NEGATIVE NEGATIVE   Ketones, ur 5 (A) NEGATIVE mg/dL   Protein, ur 30 (A) NEGATIVE mg/dL   Nitrite NEGATIVE NEGATIVE   Leukocytes,Ua NEGATIVE NEGATIVE   RBC / HPF 0-5 0 - 5 RBC/hpf   WBC, UA 0-5 0 - 5 WBC/hpf    Comment:        Reflex urine culture not performed if WBC <=10, OR if Squamous epithelial cells >5. If Squamous epithelial cells >5 suggest recollection.    Bacteria, UA NONE SEEN NONE SEEN   Squamous Epithelial / HPF 0-5 0 - 5 /HPF   Mucus PRESENT     Comment: Performed at [facility], [address] [place], [place] [address]    Medications:  Current Facility-Administered Medications  Medication Dose Route Frequency Provider Last Rate Last Admin   ARIPiprazole (ABILIFY) tablet 5 mg  5 mg Oral Daily Ophelia Shoulder E, NP   5 mg at 03/20/23 1407   LORazepam (ATIVAN) tablet 1 mg  1 mg Oral Q6H PRN Gilda Crease, MD   1 mg at 03/19/23 0102   sterile water (preservative free) injection 1.2 mL  1.2 mL Injection Once Pollina, Canary Brim, MD       ziprasidone (GEODON) injection 20 mg  20 mg Intramuscular Q6H PRN Pollina, Canary Brim, MD       Current Outpatient Medications  Medication Sig Dispense Refill   Aspirin-Caffeine (BC FAST PAIN RELIEF PO) Take 2 packets by  mouth daily as needed (pain, headache).     LITHIUM PO Take 1-3 tablets by mouth daily as needed (mood).      Musculoskeletal: Pt moves all extremities and ambulates independently.  Strength & Muscle Tone: within normal limits Gait & Station: normal Patient leans: N/A    Psychiatric Specialty Exam:  Presentation  General Appearance: Bizarre (pt observed dangled at the bedside, pressured speech)  Eye Contact:Good  Speech:Pressured  Speech Volume:Increased  Handedness:Right   Mood and  Affect  Mood:Anxious; Euphoric  Affect:Congruent; Blunt   Thought Process  Thought Processes:Irrevelant  Descriptions of Associations:Circumstantial  Orientation:Partial  Thought Content:Illogical; Scattered; Delusions  History of Schizophrenia/Schizoaffective disorder:No  Duration of Psychotic Symptoms:Less than six months  Hallucinations:Hallucinations: None  Ideas of Reference:Delusions  Suicidal Thoughts:Suicidal Thoughts: No  Homicidal Thoughts:Homicidal Thoughts: No   Sensorium  Memory:Immediate Poor; Recent Poor; Remote Poor  Judgment:Poor  Insight:Poor   Executive Functions  Concentration:Poor  Attention Span:Poor  Recall:Poor  Fund of Knowledge:Poor  Language:Good   Psychomotor Activity  Psychomotor Activity:Psychomotor Activity: Increased   Assets  Assets:Communication Skills; Desire for Improvement; Financial Resources/Insurance; Housing; Social Support; Vocational/Educational   Sleep  Sleep:Sleep: Poor Number of Hours of Sleep: 4    Physical Exam: Physical Exam Constitutional:      Appearance: He is obese.  Cardiovascular:     Rate and Rhythm: Normal rate.     Pulses: Normal pulses.  Pulmonary:     Effort: Pulmonary effort is normal.  Musculoskeletal:        General: Normal range of motion.     Cervical back: Normal range of motion.  Neurological:     Mental Status: He is alert. He is disoriented.  Psychiatric:         Attention and Perception: Attention and perception normal.        Mood and Affect: Mood is anxious and elated. Affect is inappropriate.        Speech: Speech is rapid and pressured.        Behavior: Behavior is hyperactive. Behavior is cooperative.        Thought Content: Thought content is delusional. Thought content is not paranoid. Thought content does not include homicidal or suicidal ideation. Thought content does not include homicidal or suicidal plan.        Judgment: Judgment is impulsive and inappropriate.    Review of Systems  Constitutional: Negative.   HENT: Negative.    Eyes: Negative.   Respiratory: Negative.    Cardiovascular: Negative.   Gastrointestinal: Negative.   Genitourinary: Negative.   Musculoskeletal: Negative.   Skin: Negative.   Neurological: Negative.   Endo/Heme/Allergies: Negative.   Psychiatric/Behavioral:  Positive for memory loss (related to mental decompensation). The patient is nervous/anxious.    Blood pressure 125/69, pulse 74, temperature 97.8 F (36.6 C), temperature source Oral, resp. rate 20, height 6\' 2"  (1.88 m), weight 130.6 kg, SpO2 92%. Body mass index is 36.98 kg/m.  Treatment Plan Summary: Patient with hx bipolar 1 disorder, and medication non-compliance.  Pt presents to the emergency department voluntarily brought in by [facility] after being found in someone's home without pants.  On assessment today, patient partially oriented to person and location only; he is unsure of today's date, is delusional, has pressured speech and is a poor historian. His parents are at the bedside substantiating their son is mentally decompensated since stopping psychotropic medications and expresses their belief he is a safety concern and not appropriate for discharge today. Considering patient lacks good decision making capacity, he is referred for inpatient admission where he can be started on psychotropic medications and monitored for  safety and mood stability.  Daily contact with patient to assess and evaluate symptoms and progress in treatment and Medication management  Medication: Per patient's father, patient used to take lithium but no longer wants to take it d/t work exposure to sun and intolerable side effects.  Based on his history of medication non-adherence, agree lithium may not be the best  fit for him as it has a low therapeutic index for toxicity and requires adherence and strict monitoring.    Medications: Start  Abilify 5mg  po daily for mood Hydroxyzine 25mg  po TID prn anxiety  Disposition: Recommend psychiatric Inpatient admission when medically cleared.  This service was provided via telemedicine using a 2-way, interactive audio and video technology.  Dr. Vanetta Mulders; [place], LCSW, Bartholome Bill, RN were all informed of above recommendation and disposition via secure messaging.  Names of all persons participating in this telemedicine service and their role in this encounter. Name: Jared Costa Role: Patient  Name: Ruby Cyran Role: Patient's mother  Name: Alinda Money Glance  Role: Patient's father  Name: Ophelia Shoulder Role: PMHNP  Name: Gretta Cool Role: Psychiatrist    Chales Abrahams, NP 03/20/2023 6:21 PM

## 2023-03-20 NOTE — ED Notes (Signed)
Pt took medication w/o issue.  Pt remains calm and cooperative.  Denies SI, HI, and AVH.

## 2023-03-20 NOTE — Progress Notes (Signed)
LCSW Progress Note  [id number]   Jared Costa  03/20/2023  12:24 PM  Description:   Inpatient Psychiatric Referral  Patient was recommended inpatient per Ophelia Shoulder, NP. There are no available beds at [facility], per [facility] Rona Ravens, RN. Patient was referred to the following out of network facilities:   Destination  Service Provider Address Phone Fax  [facility]  [address] [place], [place] [address] [phone]  [facility]  [address] [place] [address] [phone]  [facility] [address]  [place] [address] [phone]  [facility]  [address] [place], [place] [address] [phone]  [facility]  [address], [place] [address] [phone]  [facility]  [address] [place], [place] [address] [phone]  [facility]  [address] [place] [address] [phone]  [facility]  [address] [place] [address] [phone]  [facility]  [address], [place] [address] [phone]  [facility]  [address] [place] [address] [phone]  [facility]  [address] [place] [address] [phone]  [facility]FA[facility]  [address] [place], [place] [phone]  [facility]  [address] [place] [address] [phone]  [facility]  [address], [place] [address] [phone]  [facility]  [address], [place] [address] [phone]  [facility] EFAX  [phone]  [phone]    Situation ongoing, CSW to continue following and update chart as more information becomes available.    Cathie Beams, MSW, LCSW  03/20/2023 12:24 PM

## 2023-03-20 NOTE — ED Provider Notes (Signed)
Emergency Medicine Observation Re-evaluation Note  Jared Costa is a 52 y.o. male, seen on rounds today.  Pt initially presented to the ED for complaints of Psychiatric Evaluation Currently, the patient is resting on stretcher.  Physical Exam  BP (!) 132/93 (BP Location: Left Arm)   Pulse 82   Temp 98.2 F (36.8 C) (Oral)   Resp 20   Ht 6\' 2"  (1.88 m)   Wt 130.6 kg   SpO2 94%   BMI 36.98 kg/m  Physical Exam General: nad Cardiac: normal rate Lungs: no resp distress Psych: calm  ED Course / MDM  EKG:EKG Interpretation Date/Time:  Sunday March 19 2023 12:53:07 EDT Ventricular Rate:  93 PR Interval:  176 QRS Duration:  80 QT Interval:  350 QTC Calculation: 435 R Axis:   46  Text Interpretation: Normal sinus rhythm Normal ECG No previous ECGs available Confirmed by Dione Booze ([id number]) on 03/19/2023 10:46:15 PM  I have reviewed the labs performed to date as well as medications administered while in observation.  Recent changes in the last 24 hours include pending placement.  Plan  Current plan is for placement.    Sloan Leiter, DO 03/20/23 [phone]

## 2023-03-20 NOTE — ED Notes (Signed)
Pt ambulated to restroom independently.

## 2023-03-20 NOTE — ED Notes (Signed)
Tele psych cart at bedside

## 2023-03-20 NOTE — ED Notes (Signed)
Patient has been having yelling and cursing outbursts and has been laughing uncontrollably and talking to himself.

## 2023-03-20 NOTE — Progress Notes (Signed)
Pt has been accepted to [facility] 03/21/2023. Bed assignment: Main campus  Pt meets inpatient criteria per Ophelia Shoulder, NP  Attending Physician will be Loni Beckwith, MD  Report can be called to: [phone] (this is a pager, please leave call-back number when giving report)  Pt can arrive after 8 AM  Care Team Notified: Ophelia Shoulder, NP and Lilli Light, RN  Cathie Beams, MSW, LCSW  03/20/2023 1:25 PM

## 2023-03-21 NOTE — ED Provider Notes (Signed)
Emergency Medicine Observation Re-evaluation Note  Jared Costa is a 52 y.o. male, seen on rounds today.  Pt initially presented to the ED for complaints of Psychiatric Evaluation Currently, the patient is awaiting placement  Physical Exam  BP 115/87 (BP Location: Right Arm)   Pulse 96   Temp 97.7 F (36.5 C) (Oral)   Resp 20   Ht 6\' 2"  (1.88 m)   Wt 130.6 kg   SpO2 95%   BMI 36.98 kg/m  Physical Exam Alert in no acute distress  ED Course / MDM  EKG:EKG Interpretation Date/Time:  Sunday March 19 2023 12:53:07 EDT Ventricular Rate:  93 PR Interval:  176 QRS Duration:  80 QT Interval:  350 QTC Calculation: 435 R Axis:   46  Text Interpretation: Normal sinus rhythm Normal ECG No previous ECGs available Confirmed by Dione Booze ([id number]) on 03/19/2023 10:46:15 PM  I have reviewed the labs performed to date as well as medications administered while in observation.  Recent changes in the last 24 hours include none  Plan  Current plan is for placement.    Bethann Berkshire, MD 03/21/23 [phone]

## 2023-03-21 NOTE — ED Notes (Signed)
Pt alert, NAD, calm, interactive, resps e/u, speaking clearly, cooperative, polite, steady gait. VSS. Given belongings. Out with SD to go to [facility].

## 2023-03-21 NOTE — ED Notes (Signed)
[facility] pager contacted re: pt report, # left for call back
